# Patient Record
Sex: Male | Born: 1970 | Hispanic: Yes | Marital: Married | Smoking: Never smoker
Health system: Southern US, Community
[De-identification: ages and names within clinical notes are randomized; demographics above are authoritative.]

## PROBLEM LIST (undated history)

## (undated) DIAGNOSIS — I10 Essential (primary) hypertension: Secondary | ICD-10-CM

## (undated) HISTORY — PX: EYE SURGERY: SHX253

## (undated) HISTORY — DX: Essential (primary) hypertension: I10

---

## 2013-03-25 ENCOUNTER — Ambulatory Visit: Payer: BC Managed Care – PPO

## 2013-03-25 ENCOUNTER — Ambulatory Visit (INDEPENDENT_AMBULATORY_CARE_PROVIDER_SITE_OTHER): Payer: BC Managed Care – PPO | Admitting: Emergency Medicine

## 2013-03-25 VITALS — BP 132/80 | HR 76 | Temp 98.4°F | Resp 16 | Ht 67.75 in | Wt 209.6 lb

## 2013-03-25 DIAGNOSIS — R109 Unspecified abdominal pain: Secondary | ICD-10-CM

## 2013-03-25 DIAGNOSIS — K5900 Constipation, unspecified: Secondary | ICD-10-CM

## 2013-03-25 LAB — POCT CBC
Granulocyte percent: 49.5 %G (ref 37–80)
HCT, POC: 47.7 % (ref 43.5–53.7)
HEMOGLOBIN: 16.1 g/dL (ref 14.1–18.1)
LYMPH, POC: 2.6 (ref 0.6–3.4)
MCH: 29.9 pg (ref 27–31.2)
MCHC: 33.8 g/dL (ref 31.8–35.4)
MCV: 88.7 fL (ref 80–97)
MID (CBC): 0.4 (ref 0–0.9)
MPV: 12.1 fL (ref 0–99.8)
POC Granulocyte: 2.9 (ref 2–6.9)
POC LYMPH PERCENT: 44.2 %L (ref 10–50)
POC MID %: 6.3 % (ref 0–12)
Platelet Count, POC: 261 10*3/uL (ref 142–424)
RBC: 5.38 M/uL (ref 4.69–6.13)
RDW, POC: 12.8 %
WBC: 5.9 10*3/uL (ref 4.6–10.2)

## 2013-03-25 LAB — POCT URINALYSIS DIPSTICK
Bilirubin, UA: NEGATIVE
GLUCOSE UA: NEGATIVE
Ketones, UA: NEGATIVE
LEUKOCYTES UA: NEGATIVE
NITRITE UA: NEGATIVE
Protein, UA: NEGATIVE
Spec Grav, UA: >=1.03
UROBILINOGEN UA: 0.2
pH, UA: 6.5

## 2013-03-25 LAB — AMYLASE: Amylase: 39 U/L (ref 0–105)

## 2013-03-25 LAB — COMPREHENSIVE METABOLIC PANEL
ALT: 29 U/L (ref 0–53)
AST: 30 U/L (ref 0–37)
Albumin: 4.5 g/dL (ref 3.5–5.2)
Alkaline Phosphatase: 70 U/L (ref 39–117)
BILIRUBIN TOTAL: 0.4 mg/dL (ref 0.2–1.2)
BUN: 23 mg/dL (ref 6–23)
CO2: 27 mEq/L (ref 19–32)
CREATININE: 0.98 mg/dL (ref 0.50–1.35)
Calcium: 9.3 mg/dL (ref 8.4–10.5)
Chloride: 100 mEq/L (ref 96–112)
Glucose, Bld: 85 mg/dL (ref 70–99)
Potassium: 4.5 mEq/L (ref 3.5–5.3)
Sodium: 135 mEq/L (ref 135–145)
Total Protein: 7.8 g/dL (ref 6.0–8.3)

## 2013-03-25 LAB — LIPASE: LIPASE: 24 U/L (ref 0–75)

## 2013-03-25 MED ORDER — POLYETHYLENE GLYCOL 3350 17 GM/SCOOP PO POWD: 17.0000 g | Freq: Every day | ORAL | Status: AC

## 2013-03-25 NOTE — Patient Instructions (Signed)
Constipación - Adulto   (Constipation, Adult)   Constipación significa que una persona tiene menos de 3 evacuaciones en una semana, hay dificultad para evacuar el intestino, o las heces son secas, duras, o más grandes que lo normal. A medida que envejecemos el estreñimiento es más común. Si intenta curar el estreñimiento con medicamentos que producen la evacuación intestinal (laxantes), el problema puede empeorar. El uso prolongado de laxantes puede hacer que los músculos del colon se debiliten. Una dieta baja en fibra, no tomar suficientes líquidos y el uso de ciertos medicamentos pueden empeorar el estreñimiento.   CAUSAS   · Ciertos medicamentos, como los antidepresivos, analgésicos, suplementos de hierro, antiácidos y diuréticos.    · Algunas enfermedades, como la diabetes, el síndrome del colon irritable (SII), enfermedad de la tiroides, o depresión.    · No beber suficiente agua.    · No consumir suficientes alimentos ricos en fibra.    · Situaciones de estrés o viajes.  · Falta de actividad física o de ejercicio.  · No ir al baño cuando siente la necesidad.  · Ignorar la necesidad súbita de mover el intestino.  · Uso en exceso de laxantes.  SÍNTOMAS   · Evacuar el intestino menos de 3 veces a la semana.    · Dificultad para mover el intestino    · Tener las heces secas y duras, o más grandes que las normales.    · Sensación de estar lleno o distendido.    · Dolor en la parte baja del abdomen  · No se siente alivio después de evacuar el intestino.  DIAGNÓSTICO   El médico le hará una historia clínica y le hará un examen físico. Pueden hacerle exámenes adicionales para el estreñimiento grave. Algunas pruebas son:   · Un radiografía con enema de bario para examinar el recto, el colon y en algunos casos el intestino delgado.  · Una sigmoidoscopia para examinar el colon inferior.  · Una colonoscopia para examinar todo el colon.  TRATAMIENTO   El tratamiento dependerá de la gravedad de la constipación y de la  causa. Algunos tratamientos dietéticos son beber más líquidos y comer más alimentos ricos en fibra. El cambio en el estilo de vida incluye hacer ejercicios de manera regular. Si estas recomendaciones para realizar cambios en la dieta y en el estilo de vida no ayudan, el médico le puede indicar el uso de laxantes de venta libre para favorecer el movimiento intestinal. Los medicamentos con receta se pueden prescribir si los medicamentos de venta libre no lo mejoran.   INSTRUCCIONES PARA EL CUIDADO EN EL HOGAR   · Aumente el consumo de alimentos con fibra, como frutas, verduras, granos enteros y frijoles. Limite los azúcares ricos en grasas y procesados   en su dieta, tales como papas fritas, hamburguesas, galletas, dulces y refrescos.    · Puede agregar un suplemento de fibra a su dieta si no obtiene lo suficiente de los alimentos.    · Debe ingerir gran cantidad de líquido para mantener la orina de tono claro o color amarillo pálido.    · Haga ejercicios regularmente o según las indicaciones de su médico.    · Vaya al baño cuando sienta la necesidad de ir. No espere.  · Tome sólo la medicación que le indicó el profesional.  No tome otros medicamentos para la constipación sin consultar a su médico.  SOLICITE ATENCIÓN MÉDICA DE INMEDIATO SI:   · Observa sangre brillante en las heces.    · La constipación dura más de 4 días o empeora.    · Siente   dolor abdominal o rectal.    · Las heces son delgadas como un lápiz.  · Pierde peso de manera inexplicable.  ASEGÚRESE DE QUE:   · Comprende estas instrucciones.  · Controlará su enfermedad.  · Solicitará ayuda de inmediato si no mejora o empeora.  Document Released: 01/27/2007 Document Revised: 07/09/2011  Exit[employer]® Patient Information ©2014 [employer].

## 2013-03-25 NOTE — Progress Notes (Signed)
[facility] [address], [place] [id number] [phone]  Date:  03/25/2013   Name:  Samuel Bartlett   DOB:  08/23/1970   MRN:  [id number]  PCP:  No primary provider on file.    Chief Complaint: Abdominal Pain, Otalgia and Perianal Pain   History of Present Illness:  Samuel Bartlett is a 43 y.o. very pleasant male patient who presents with the following:  Say she has abdominal pain for the past eight months left abdomen when sits for hours.  Has frequent heartburn. No stool change, melena or BRBPR.  No nausea or vomiting.  No hematemesis.  Eating ok no weight loss.  No food intolerance.  Non smoker. No ASA or NSAID.  Stopped drinking a year ago.  No fever or chills.  Normal appetite.  No weight loss.  No improvement with over the counter medications or other home remedies. Denies other complaint or health concern today.   There are no active problems to display for this patient.   History reviewed. No pertinent past medical history.  Past Surgical History  Procedure Laterality Date  . Eye surgery      History  Substance Use Topics  . Smoking status: Never Smoker   . Smokeless tobacco: Not on file  . Alcohol Use: Yes    History reviewed. No pertinent family history.  No Known Allergies  Medication list has been reviewed and updated.  No current outpatient prescriptions on file prior to visit.   No current facility-administered medications on file prior to visit.    Review of Systems:  As per HPI, otherwise negative.    Physical Examination: Filed Vitals:   03/25/13 1411  BP: 132/80  Pulse: 76  Temp: 98.4 F (36.9 C)  Resp: 16   Filed Vitals:   03/25/13 1411  Height: 5' 7.75" (1.721 m)  Weight: 209 lb 9.6 oz (95.074 kg)   Body mass index is 32.1 kg/(m^2). Ideal Body Weight: Weight in (lb) to have BMI = 25: 162.9  GEN: WDWN, NAD, Non-toxic, A & O x 3 HEENT: Atraumatic, Normocephalic. Neck supple. No masses, No LAD. Ears and Nose: No  external deformity. CV: RRR, No M/G/R. No JVD. No thrill. No extra heart sounds. PULM: CTA B, no wheezes, crackles, rhonchi. No retractions. No resp. distress. No accessory muscle use. ABD: S, NT, ND, +BS. No rebound. No HSM. EXTR: No c/c/e NEURO Normal gait.  PSYCH: Normally interactive. Conversant. Not depressed or anxious appearing.  Calm demeanor.    Assessment and Plan: Abdominal pain  Constipation Labs pending miralax  Signed,  Phillips OdorJeffery Aerion Bagdasarian, MD   Results for orders placed in visit on 03/25/13  POCT CBC      Result Value Ref Range   WBC 5.9  4.6 - 10.2 K/uL   Lymph, poc 2.6  0.6 - 3.4   POC LYMPH PERCENT 44.2  10 - 50 %L   MID (cbc) 0.4  0 - 0.9   POC MID % 6.3  0 - 12 %M   POC Granulocyte 2.9  2 - 6.9   Granulocyte percent 49.5  37 - 80 %G   RBC 5.38  4.69 - 6.13 M/uL   Hemoglobin 16.1  14.1 - 18.1 g/dL   HCT, POC 57.847.7  46.943.5 - 53.7 %   MCV 88.7  80 - 97 fL   MCH, POC 29.9  27 - 31.2 pg   MCHC 33.8  31.8 - 35.4 g/dL   RDW, POC 12.8  Platelet Count, POC 261  142 - 424 K/uL   MPV 12.1  0 - 99.8 fL  POCT URINALYSIS DIPSTICK      Result Value Ref Range   Color, UA yellow     Clarity, UA clear     Glucose, UA neg     Bilirubin, UA neg     Ketones, UA neg     Spec Grav, UA >=1.030     Blood, UA tr-intact     pH, UA 6.5     Protein, UA neg     Urobilinogen, UA 0.2     Nitrite, UA neg     Leukocytes, UA Negative    [facility] reading (PRIMARY) by  Dr. Dareen Piano.  Negative AAS.

## 2016-05-11 ENCOUNTER — Encounter: Payer: Self-pay | Admitting: Gastroenterology

## 2017-03-04 ENCOUNTER — Emergency Department (HOSPITAL_COMMUNITY)
Admission: EM | Admit: 2017-03-04 | Discharge: 2017-03-04 | Disposition: A | Discharge status: Home / Self Care | Payer: BLUE CROSS/BLUE SHIELD | Attending: Emergency Medicine | Admitting: Emergency Medicine

## 2017-03-04 ENCOUNTER — Emergency Department (HOSPITAL_COMMUNITY): Payer: BLUE CROSS/BLUE SHIELD

## 2017-03-04 ENCOUNTER — Encounter (HOSPITAL_COMMUNITY): Payer: Self-pay | Admitting: *Deleted

## 2017-03-04 DIAGNOSIS — Z79899 Other long term (current) drug therapy: Secondary | ICD-10-CM | POA: Diagnosis not present

## 2017-03-04 DIAGNOSIS — R1084 Generalized abdominal pain: Secondary | ICD-10-CM | POA: Insufficient documentation

## 2017-03-04 LAB — CBC
HEMATOCRIT: 47.1 % (ref 39.0–52.0)
HEMOGLOBIN: 16 g/dL (ref 13.0–17.0)
MCH: 29.7 pg (ref 26.0–34.0)
MCHC: 34 g/dL (ref 30.0–36.0)
MCV: 87.4 fL (ref 78.0–100.0)
PLATELETS: 255 10*3/uL (ref 150–400)
RBC: 5.39 MIL/uL (ref 4.22–5.81)
RDW: 12.7 % (ref 11.5–15.5)
WBC: 5.6 10*3/uL (ref 4.0–10.5)

## 2017-03-04 LAB — COMPREHENSIVE METABOLIC PANEL
ALT: 49 U/L (ref 17–63)
ANION GAP: 7 (ref 5–15)
AST: 36 U/L (ref 15–41)
Albumin: 3.8 g/dL (ref 3.5–5.0)
Alkaline Phosphatase: 75 U/L (ref 38–126)
BUN: 20 mg/dL (ref 6–20)
CHLORIDE: 109 mmol/L (ref 101–111)
CO2: 23 mmol/L (ref 22–32)
CREATININE: 0.89 mg/dL (ref 0.61–1.24)
Calcium: 9 mg/dL (ref 8.9–10.3)
Glucose, Bld: 107 mg/dL — ABNORMAL HIGH (ref 65–99)
POTASSIUM: 4.3 mmol/L (ref 3.5–5.1)
SODIUM: 139 mmol/L (ref 135–145)
Total Bilirubin: 0.4 mg/dL (ref 0.3–1.2)
Total Protein: 6.6 g/dL (ref 6.5–8.1)

## 2017-03-04 LAB — URINALYSIS, ROUTINE W REFLEX MICROSCOPIC
Bilirubin Urine: NEGATIVE
GLUCOSE, UA: NEGATIVE mg/dL
HGB URINE DIPSTICK: NEGATIVE
Ketones, ur: NEGATIVE mg/dL
LEUKOCYTES UA: NEGATIVE
Nitrite: NEGATIVE
PROTEIN: NEGATIVE mg/dL
SPECIFIC GRAVITY, URINE: 1.021 (ref 1.005–1.030)
pH: 5 (ref 5.0–8.0)

## 2017-03-04 LAB — LIPASE, BLOOD: LIPASE: 40 U/L (ref 11–51)

## 2017-03-04 MED ORDER — OMEPRAZOLE 20 MG PO CPDR: 20.0000 mg | DELAYED_RELEASE_CAPSULE | Freq: Every day | ORAL | 0 refills | Status: AC

## 2017-03-04 MED ORDER — IOPAMIDOL (ISOVUE-300) INJECTION 61%
INTRAVENOUS | Status: AC
  Administered 2017-03-04: 100 mL
  Filled 2017-03-04: qty 100

## 2017-03-04 MED ORDER — SUCRALFATE 1 GM/10ML PO SUSP: 1.0000 g | Freq: Three times a day (TID) | ORAL | 0 refills | Status: AC

## 2017-03-04 NOTE — ED Notes (Signed)
ED Provider at bedside. 

## 2017-03-04 NOTE — ED Triage Notes (Signed)
Pt states for the last few years he has had intermittent RUQ pain that seems to come and go pt was seen for this before a year ago and had negative US. Pt states she when pain is present he sometimes has loose stools. Pt also reports weight loss over the last few months.

## 2017-03-04 NOTE — ED Notes (Signed)
Pt stats he understands instructions. Home stble with steady gait.

## 2017-03-04 NOTE — Discharge Instructions (Signed)
Your workup has been very reassuring in the ED.  Your CAT scan shows no acute findings.  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  You can follow-up with your primary care doctor with a CT scan report.  I would also follow-up with a GI doctor because you may need endoscopy or colonoscopy at some point.  Take the Prilosec and Carafate as needed to help with acid reflux.

## 2017-03-04 NOTE — ED Notes (Signed)
Patient transported to CT 

## 2017-03-04 NOTE — ED Notes (Signed)
Pt reports he had weight loss from 210 lbs down to 204 lbs

## 2017-03-04 NOTE — ED Provider Notes (Signed)
[facility] EMERGENCY DEPARTMENT Provider Note   CSN: [id number] Arrival date & time: 03/04/17  1105     History   Chief Complaint Chief Complaint  Patient presents with  . Abdominal Pain    HPI Samuel Bartlett is a 47 y.o. male.  HPI 47 year old Hispanic male with no pertinent past medical history presents to the emergency department today with complaints of generalized abdominal pain.  Patient states that he has had intermittent belly pain for the last 2-3 years.  He was seen last year for same symptoms with a negative ultrasound at that time and diagnosed with chronic abdominal pain.  He was encouraged to follow-up with GI which he has failed to do so.  Patient states that there is no aggravating factors.  There is no relieving factors.  Patient is not taking medications for his symptoms.  Not associated with food.  Describes as cramping in nature that self resolved after several minutes.  This is more generalized than any focal abdominal pain.  He does report some loose stools at times.  He reports a 4 pound weight loss over the past 2 years.  Patient has never had an endoscopy or colonoscopy.  Denies any nausea or emesis.  Denies any associated fever.  Denies any bloody stools. States that he just finally came in today to get it checked out.   Pt denies any fever, chill, ha, vision changes, lightheadedness, dizziness, congestion, neck pain, cp, sob, cough, n/v/d, urinary symptoms, melena, hematochezia, lower extremity paresthesias. No past medical history on file.  There are no active problems to display for this patient.   Past Surgical History:  Procedure Laterality Date  . EYE SURGERY         Home Medications    Prior to Admission medications   Medication Sig Start Date End Date Taking? Authorizing Provider  polyethylene glycol powder (GLYCOLAX/MIRALAX) powder Take 17 g by mouth daily. 03/25/13   Carmelina Dane, MD  ranitidine (ZANTAC) 150 MG tablet  Take 150 mg by mouth 2 (two) times daily.    [provider]    Family History No family history on file.  Social History Social History   Tobacco Use  . Smoking status: Never Smoker  Substance Use Topics  . Alcohol use: Yes  . Drug use: No     Allergies   Patient has no known allergies.   Review of Systems Review of Systems  Constitutional: Negative for chills and fever.  HENT: Negative for congestion and sore throat.   Eyes: Negative for visual disturbance.  Respiratory: Negative for cough and shortness of breath.   Cardiovascular: Negative for chest pain.  Gastrointestinal: Positive for abdominal pain (non at this time). Negative for diarrhea, nausea and vomiting.  Genitourinary: Negative for dysuria, flank pain, frequency, hematuria, scrotal swelling, testicular pain and urgency.  Musculoskeletal: Negative for arthralgias and myalgias.  Skin: Negative for rash.  Neurological: Negative for dizziness, syncope, weakness, light-headedness, numbness and headaches.  Psychiatric/Behavioral: Negative for sleep disturbance. The patient is not nervous/anxious.      Physical Exam Updated Vital Signs BP (!) 141/99 (BP Location: Right Arm)   Pulse 64   Temp 98.1 F (36.7 C) (Oral)   Resp 16   Ht 5\' 5"  (1.651 m)   Wt 93.4 kg (206 lb)   SpO2 99%   BMI 34.28 kg/m   Physical Exam  Constitutional: He is oriented to person, place, and time. He appears well-developed and well-nourished.  Non-toxic appearance.  No distress.  HENT:  Head: Normocephalic and atraumatic.  Mouth/Throat: Oropharynx is clear and moist.  Eyes: Conjunctivae are normal. Pupils are equal, round, and reactive to light. Right eye exhibits no discharge. Left eye exhibits no discharge.  Neck: Normal range of motion. Neck supple.  Cardiovascular: Normal rate, regular rhythm, normal heart sounds and intact distal pulses. Exam reveals no gallop and no friction rub.  No murmur heard. Pulmonary/Chest:  Effort normal and breath sounds normal. No respiratory distress. He exhibits no tenderness.  Abdominal: Soft. Bowel sounds are normal. He exhibits no distension. There is no tenderness. There is no rigidity, no rebound, no guarding, no CVA tenderness, no tenderness at McBurney's point and negative Murphy's sign.  Musculoskeletal: Normal range of motion. He exhibits no tenderness.  Lymphadenopathy:    He has no cervical adenopathy.  Neurological: He is alert and oriented to person, place, and time.  Skin: Skin is warm and dry. Capillary refill takes less than 2 seconds. No rash noted.  Psychiatric: His behavior is normal. Judgment and thought content normal.  Nursing note and vitals reviewed.    ED Treatments / Results  Labs (all labs ordered are listed, but only abnormal results are displayed) Labs Reviewed  COMPREHENSIVE METABOLIC PANEL - Abnormal; Notable for the following components:      Result Value   Glucose, Bld 107 (*)    All other components within normal limits  LIPASE, BLOOD  CBC  URINALYSIS, ROUTINE W REFLEX MICROSCOPIC    EKG  EKG Interpretation None       Radiology No results found.  Procedures Procedures (including critical care time)  Medications Ordered in ED Medications - No data to display   Initial Impression / Assessment and Plan / ED Course  I have reviewed the triage vital signs and the nursing notes.  Pertinent labs & imaging results that were available during my care of the patient were reviewed by me and considered in my medical decision making (see chart for details).     Patient reports the ED with intermittent abdominal pain over the past 3-4 years.  Denies any pain today or at this time.  Denies any other associated symptoms.  Patient is nontoxic, nonseptic appearing, in no apparent distress.  Patient's pain and other symptoms adequately managed in emergency department.  Labs, imaging and vitals reviewed.  No leukocytosis.  Liver enzymes  normal.  Normal lipase.  UA shows no signs of infection.  Patient does not meet the SIRS or Sepsis criteria.  CT scan shows no acute findings.  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  He does have a umbilical hernia containing only fat.  On repeat exam patient does not have a surgical abdomin and there are no peritoneal signs.  No indication of appendicitis, bowel obstruction, bowel perforation, cholecystitis, diverticulitis.   Patient discharged home with symptomatic treatment and given strict instructions for follow-up with their primary care physician and GI.  Pt is hemodynamically stable, in NAD, & able to ambulate in the ED. Evaluation does not show pathology that would require ongoing emergent intervention or inpatient treatment. I explained the diagnosis to the patient. Pain has been managed & has no complaints prior to dc. Pt is comfortable with above plan and is stable for discharge at this time. All questions were answered prior to disposition. Strict return precautions for f/u to the ED were discussed. Encouraged follow up with PCP.      Final Clinical Impressions(s) /  ED Diagnoses   Final diagnoses:  Generalized abdominal pain    ED Discharge Orders        Ordered    omeprazole (PRILOSEC) 20 MG capsule  Daily     03/04/17 1841    sucralfate (CARAFATE) 1 GM/10ML suspension  3 times daily with meals & bedtime     03/04/17 1841       Rise MuLeaphart, Liban Guedes T, PA-C 03/04/17 1913    Gwyneth SproutPlunkett, Whitney, MD 03/04/17 2212

## 2017-03-04 NOTE — ED Notes (Signed)
Pt oob to bathroom with steady gait. 

## 2017-03-26 ENCOUNTER — Encounter: Payer: Self-pay | Admitting: Gastroenterology

## 2017-05-06 ENCOUNTER — Encounter: Payer: Self-pay | Admitting: Gastroenterology

## 2017-05-06 ENCOUNTER — Ambulatory Visit (INDEPENDENT_AMBULATORY_CARE_PROVIDER_SITE_OTHER): Payer: BLUE CROSS/BLUE SHIELD | Admitting: Gastroenterology

## 2017-05-06 VITALS — BP 122/80 | HR 72 | Ht 66.0 in | Wt 214.0 lb

## 2017-05-06 DIAGNOSIS — K5900 Constipation, unspecified: Secondary | ICD-10-CM

## 2017-05-06 DIAGNOSIS — K219 Gastro-esophageal reflux disease without esophagitis: Secondary | ICD-10-CM | POA: Diagnosis not present

## 2017-05-06 DIAGNOSIS — K760 Fatty (change of) liver, not elsewhere classified: Secondary | ICD-10-CM

## 2017-05-06 MED ORDER — OMEPRAZOLE 20 MG PO CPDR: 20.0000 mg | DELAYED_RELEASE_CAPSULE | Freq: Every day | ORAL | 2 refills | Status: AC

## 2017-05-06 NOTE — Patient Instructions (Addendum)
If you are age 45 or older, your body mass index should be between 23-30. Your Body mass index is 34.54 kg/m. If this is out of the aforementioned range listed, please consider follow up with your Primary Care Provider.  If you are age 45 or younger, your body mass index should be between 19-25. Your Body mass index is 34.54 kg/m. If this is out of the aformentioned range listed, please consider follow up with your Primary Care Provider.   We have sent the following medications to your pharmacy for you to pick up at your convenience: Omeprazole 20 mg once daily for 12 weeks.  Miralax 17 grams by mouth twice daily.   Thank you,  Dr. Lynann Bolognaajesh Gupta

## 2017-05-06 NOTE — Progress Notes (Signed)
Chief Complaint: abdominal pain  Referring Provider:  Dr Ruben Im    ASSESSMENT AND PLAN;  1. Abdominal pain, chronic, right upper quadrant with negative CT 03/04/2017, normal labs including lipase. 2. Fatty liver with normal LFTs on CT and [place]. 3. BMI 33.0-33.9,adult  4. Chronic constipation (evident on CT as well). Will continue amlodipine. 5. 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   PLAN: -I have reviewed the CT scan of the abdomen and pelvis with the patient in detail.  I have shown him the actual films.  Copy of the CT report was given. -Omeprazole 20 mg p.o. once a day for 12 weeks -Stop alcohol. -Stop any and all Motrin, Advil, Aleve, etc. -Reduce weight -portion control, stop all sodas, start exercising, stop fried foods. -Monitor weight -He will call us in 6 weeks.  If he is not better, proceed with EGD for further evaluation. -He would like to hold off on colonoscopy for now.  I do agree since he is not having any red flag symptoms.  If he improves, then he should get colonoscopy at the age of 47 as a part of colorectal cancer screening.         HPI:    47 year old known to [place] from [place].  With right upper quadrant abdominal pain intermittently.  Had ultrasound done previously which showed fatty liver and right renal scarring.  Seen in the emergency room, had CT scan of the abdomen and pelvis which showed similar findings with associated constipation.  He had normal CBC, CMP and lipase.  He has been having heartburn with occasional regurgitation but denies having any nausea/vomiting.  He does drink occasional alcohol.  He denies having any fever chills or night sweats.  There has been no recent weight loss.  He would occasionally get constipated.  He was given omeprazole and Carafate in the emergency room and told to follow-up in the GI clinic.  He denies use of any significant nonsteroidals.   Review of Systems: General: Denies any fever, chills, night sweats,  anorexia, fatigue, weakness, malaise, weight loss.  Skin: Denies rash, itching, dry skin, hives, moles, warts, or unhealing ulcers.  HEENT: Not present - nasal congestion, sinus pain, hoarseness and sore throat. Respiratory: Denies dyspnea at rest, dyspnea with exercise, cough, sputum, wheezing, coughing up blood. Cardiovascular: Denies any chest pain, angina, palpitations, syncope, orthopnea, PND, peripheral edema, and claudication. GU : Denies urinary burning, blood in urine, urinary frequency, urinary hesitancy, nocturnal urination, and urinary incontinence. Musculoskeletal: denies joint pain, extremity pain. Denies muscle weakness, cramps, atrophy.  Neurological:  Denies any headaches, dizziness, paresthesias and seizures. Psych: Denies depression, anxiety or insomnia. No memory loss, suicidal ideation, hallucinations, paranoia, and confusion. Heme: Denies bruising, bleeding, and enlarged lymph nodes or prolonged bleeding.     Past Medical History:  Diagnosis Date  . High blood pressure      Past Surgical History:  Procedure Laterality Date  . EYE SURGERY     History reviewed. No pertinent family history. Social History   Tobacco Use  . Smoking status: Never Smoker  . Smokeless tobacco: Never Used  Substance Use Topics  . Alcohol use: Yes  . Drug use: No   Current Outpatient Medications  Medication Sig Dispense Refill  . amLODipine (NORVASC) 10 MG tablet Take 10 mg by mouth daily.    . calcium carbonate (OS-CAL - DOSED IN MG OF ELEMENTAL CALCIUM) 1250 (500 Ca) MG tablet Take 1 tablet by mouth.    Marland Kitchen omeprazole (PRILOSEC)  20 MG capsule Take 1 capsule (20 mg total) by mouth daily. (Patient not taking: Reported on 05/06/2017) 30 capsule 0  . omeprazole (PRILOSEC) 20 MG capsule Take 1 capsule (20 mg total) by mouth daily. 30 capsule 2  . polyethylene glycol powder (GLYCOLAX/MIRALAX) powder Take 17 g by mouth daily. (Patient not taking: Reported on 05/06/2017) 3350 g 1  .  ranitidine (ZANTAC) 150 MG tablet Take 150 mg by mouth 2 (two) times daily.    . sucralfate (CARAFATE) 1 GM/10ML suspension Take 10 mLs (1 g total) by mouth 4 (four) times daily -  with meals and at bedtime. (Patient not taking: Reported on 05/06/2017) 420 mL 0   No current facility-administered medications for this visit.    No Known Allergies  Review of Systems:  Constitutional: Denies fever, chills, diaphoresis, appetite change and fatigue.  HEENT: Denies photophobia, eye pain, redness, hearing loss, ear pain, congestion, sore throat, rhinorrhea, sneezing, mouth sores, neck pain, neck stiffness and tinnitus.   Respiratory: Denies SOB, DOE, cough, chest tightness,  and wheezing.   Cardiovascular: Denies chest pain, palpitations and leg swelling.  Genitourinary: Denies dysuria, urgency, frequency, hematuria, flank pain and difficulty urinating.  Musculoskeletal: Denies myalgias, back pain, joint swelling, arthralgias and gait problem.  Skin: No rash. .  Neurological: Denies dizziness, seizures, syncope, weakness, light-headedness, numbness and headaches.  Hematological: Denies adenopathy. Easy bruising, personal or family bleeding history  Psychiatric/Behavioral: No anxiety or depression     Physical Exam:    BP 122/80   Pulse 72   Ht 5\' 6"  (1.676 m)   Wt 214 lb (97.1 kg)   BMI 34.54 kg/m  Constitutional:  Well-developed, in no acute distress. Psychiatric: Normal mood and affect. Behavior is normal. HEENT: Pupils normal.  Conjunctivae are normal. No scleral icterus. Neck supple.  Cardiovascular: Normal rate, regular rhythm. No edema Pulmonary/chest: Effort normal and breath sounds normal. No wheezing, rales or rhonchi. Abdominal: Soft, nondistended. Nontender. Bowel sounds active throughout. There are no masses palpable. No hepatomegaly. Sma umbilical hernia.ll  Rectal:    Neurological: Alert and oriented to person place and time. Skin: Skin is warm and dry. No rashes  noted.  CBC Latest Ref Rng & Units 03/04/2017 03/25/2013  WBC 4.0 - 10.5 K/uL 5.6 5.9  Hemoglobin 13.0 - 17.0 g/dL 78.416.0 69.616.1  Hematocrit 29.539.0 - 52.0 % 47.1 47.7  Platelets 150 - 400 K/uL 255 -   CMP Latest Ref Rng & Units 03/04/2017 03/25/2013  Glucose 65 - 99 mg/dL 284(X107(H) 85  BUN 6 - 20 mg/dL 20 23  Creatinine 3.240.61 - 1.24 mg/dL 4.010.89 0.270.98  Sodium 253135 - 145 mmol/L 139 135  Potassium 3.5 - 5.1 mmol/L 4.3 4.5  Chloride 101 - 111 mmol/L 109 100  CO2 22 - 32 mmol/L 23 27  Calcium 8.9 - 10.3 mg/dL 9.0 9.3  Total Protein 6.5 - 8.1 g/dL 6.6 7.8  Total Bilirubin 0.3 - 1.2 mg/dL 0.4 0.4  Alkaline Phos 38 - 126 U/L 75 70  AST 15 - 41 U/L 36 30  ALT 17 - 63 U/L 49 29   CT-scan of abdomen and pelvis: 03/04/2017 IMPRESSION: 1. 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 Lower pole moieties in the setting of duplicated collecting systems are more susceptible to reflux, which may have contributed to the scarring apparent today. 2. Very small umbilical hernia contains adipose tissue.  Lynann Bolognaajesh Larae Caison, MD  Cc Dr Martha Clan

## 2019-08-13 IMAGING — CT CT ABD-PELV W/ CM
2 of 5 series · 16 of 46 positions shown, 18 images · IV contrast (Omni 300)
Comparison: Radiographs from 03/25/2013

CLINICAL DATA: Upper abdominal pain for 2 years, but worsening
today.

EXAM:
CT ABDOMEN AND PELVIS WITH CONTRAST
TECHNIQUE: Multidetector CT imaging of the abdomen and pelvis was performed
using the standard protocol following bolus administration of
intravenous contrast.
CONTRAST:  100mL UFOPLH-TKK IOPAMIDOL (UFOPLH-TKK) INJECTION 61%

[Series 3: a/p w/ 5mm · axial · 0.78mm/px · z∈[-520,-60]mm · 13 of 104 slices shown, 15 images]
[im 6/104  soft-tissue]
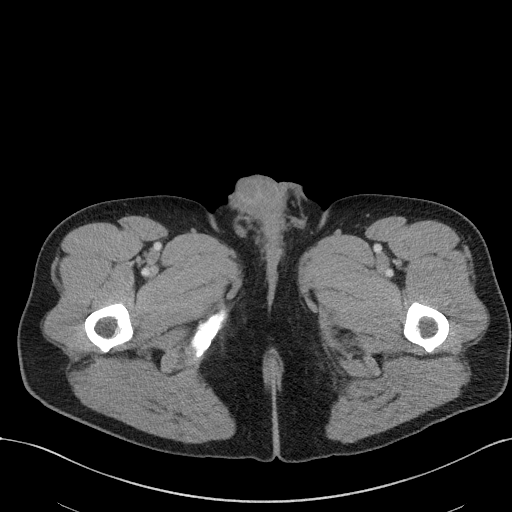
[im 6/104  bone]
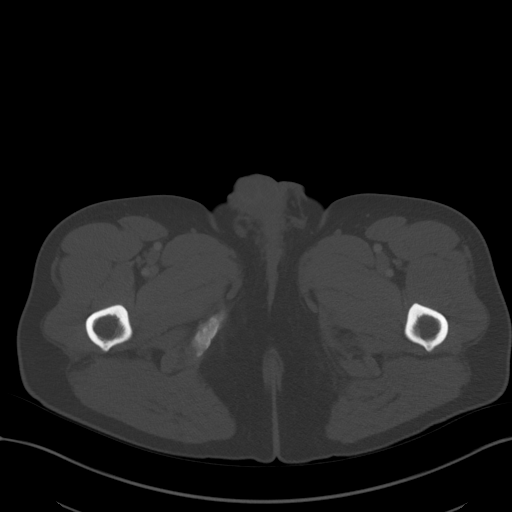
[im 17/104  soft-tissue]
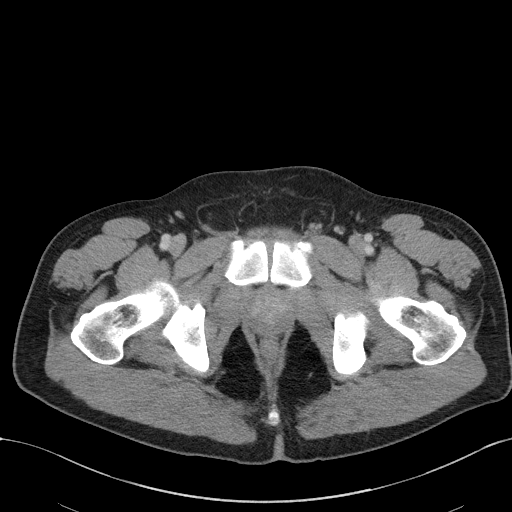
[im 22/104  soft-tissue]
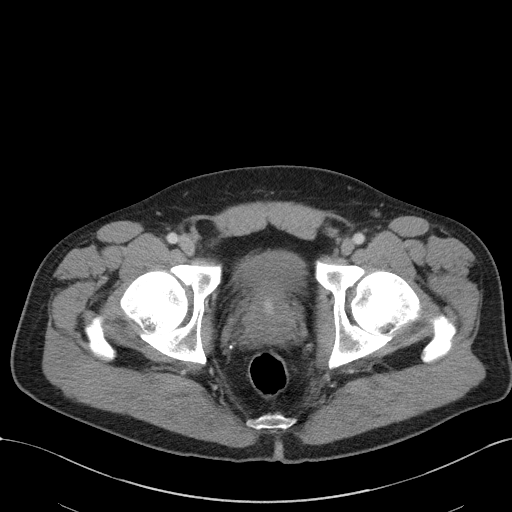
[im 28/104  soft-tissue]
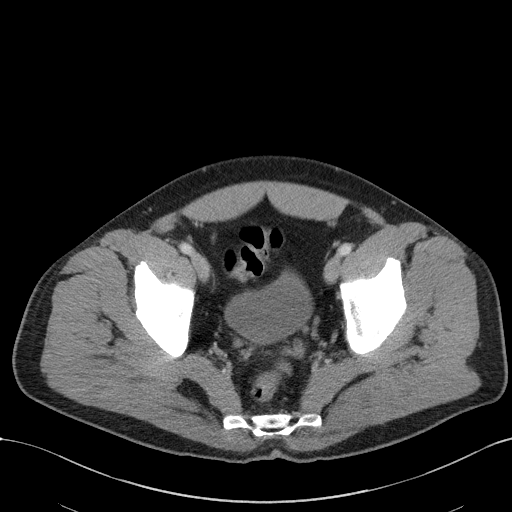
[im 38/104  soft-tissue]
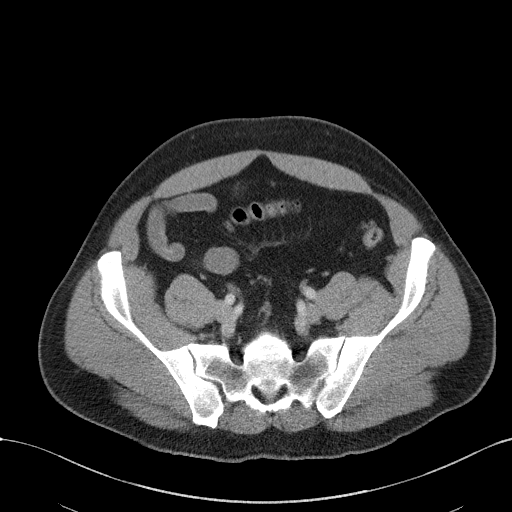
[im 44/104  soft-tissue]
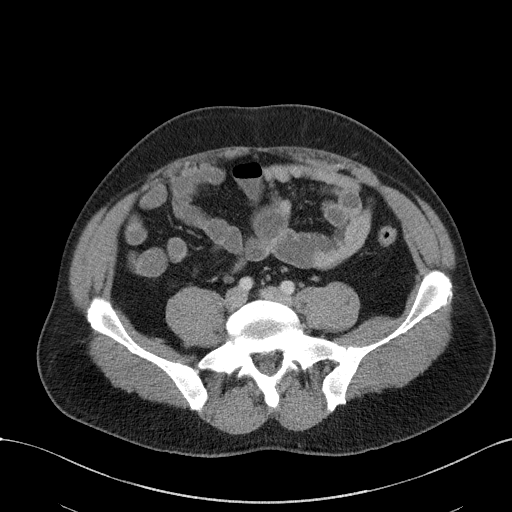
[im 55/104  soft-tissue]
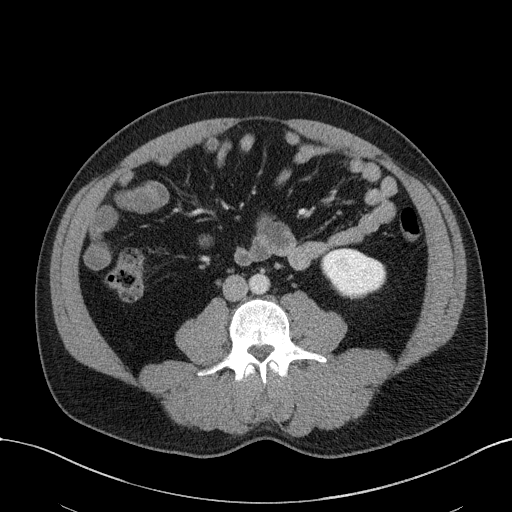
[im 60/104  soft-tissue]
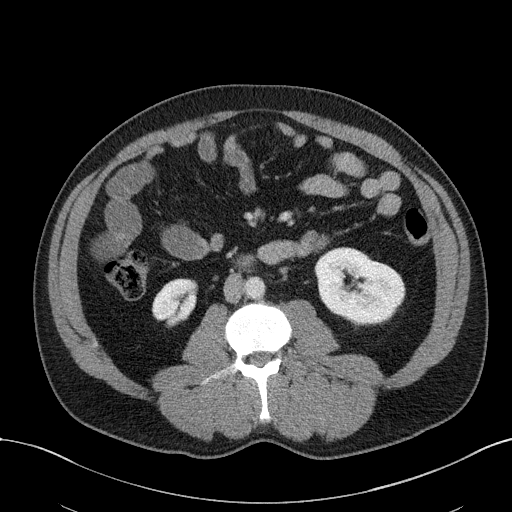
[im 66/104  soft-tissue]
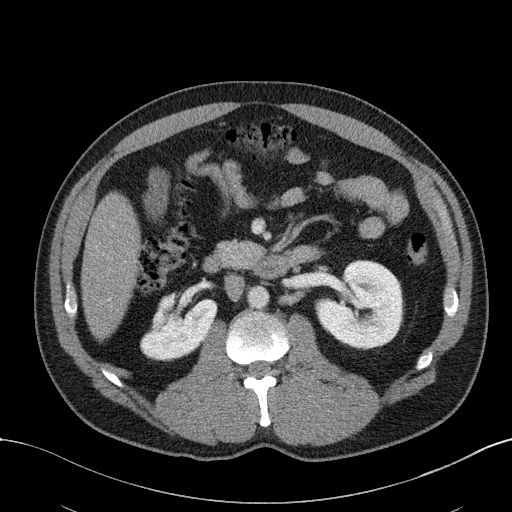
[im 66/104  bone]
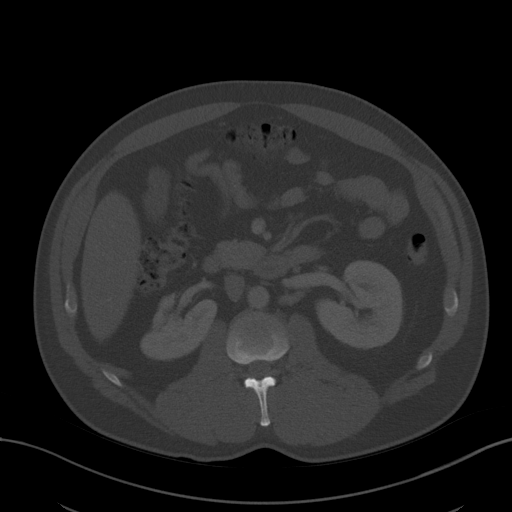
[im 76/104  soft-tissue]
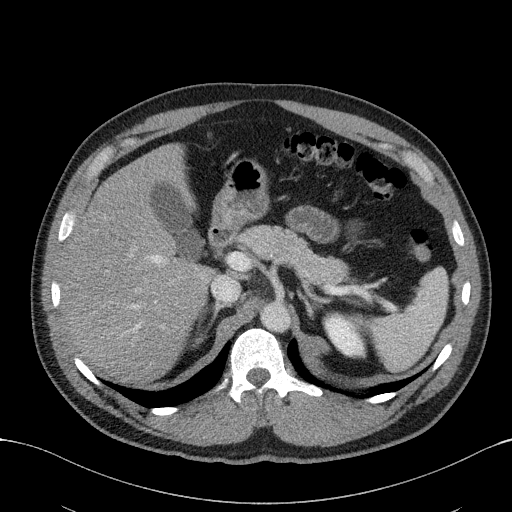
[im 82/104  soft-tissue]
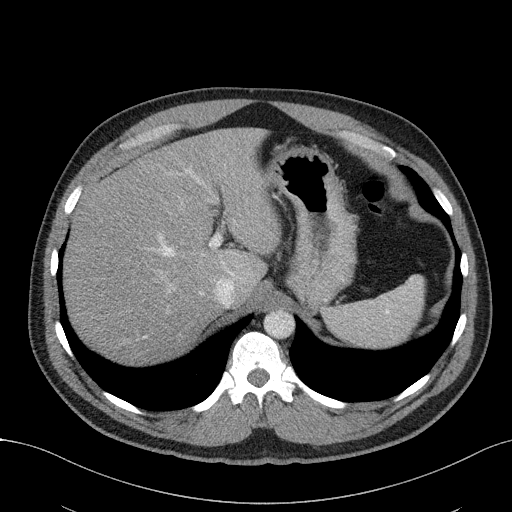
[im 87/104  soft-tissue]
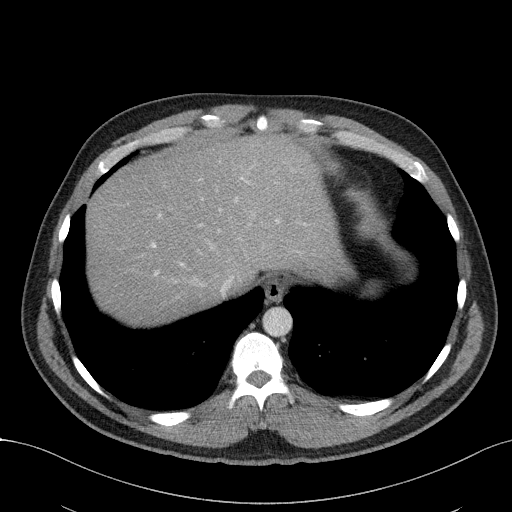
[im 98/104  soft-tissue]
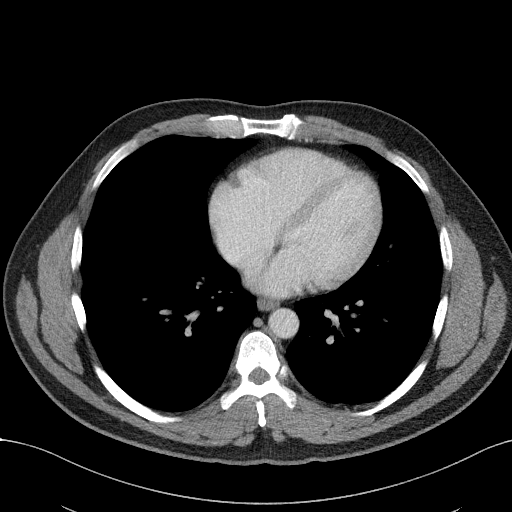

[Series 6: a/p w/ cor · coronal · 0.80mm/px · 3 of 131 slices shown]
[im 44/131  soft-tissue]
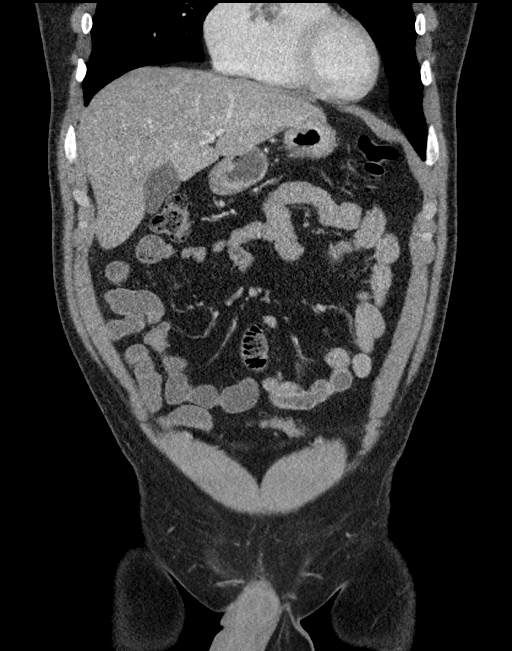
[im 58/131  soft-tissue]
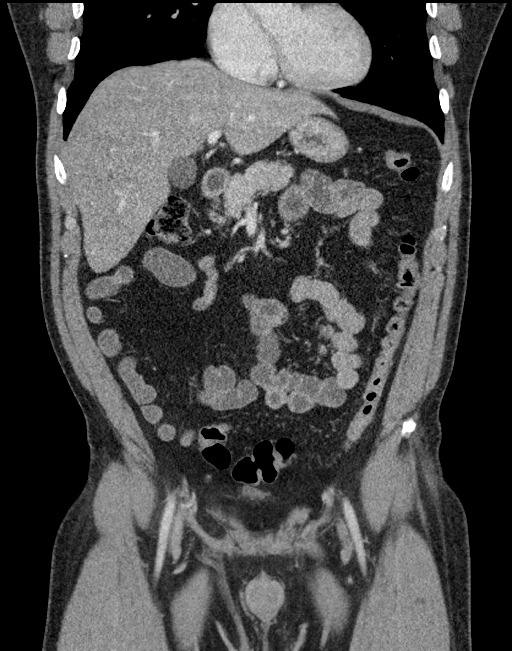
[im 73/131  soft-tissue]
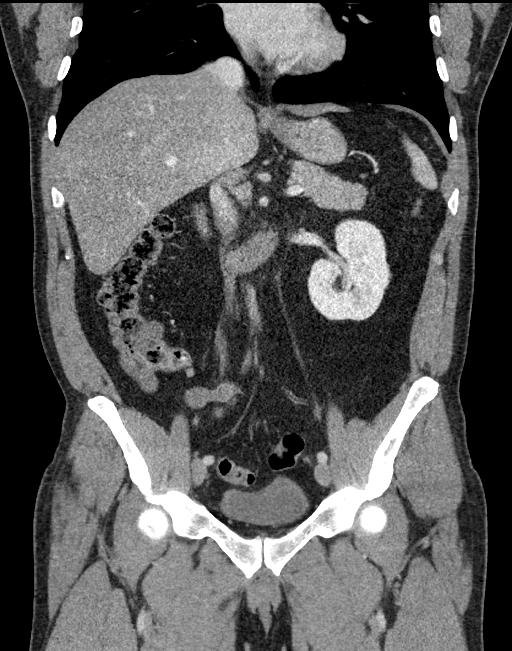

[16 of 46 positions shown; findings below may reference images not displayed]

FINDINGS: Lower chest: Unremarkable

Hepatobiliary: Unremarkable

Pancreas: Unremarkable

Spleen: Unremarkable

Adrenals/Urinary Tract: Notable scarring in the right mid kidney and
right kidney lower pole. 7 mm hypodense right mid kidney lesion
posteriorly on image 37/3 is technically too small to characterize
although statistically likely to be a benign cyst. Duplicated right
renal collecting system. No current hydroureter or urinary tract
calculi.

Stomach/Bowel: Unremarkable

Vascular/Lymphatic: Unremarkable

Reproductive: Unremarkable

Other: No supplemental non-categorized findings.

Musculoskeletal: Small umbilical hernia contains adipose tissue.
IMPRESSION: 1. Duplicated right renal collecting system with considerable
scarring of the right kidney lower pole but no current findings of
inflammation or hydroureter. Lower pole moieties in the setting of
duplicated collecting systems are more susceptible to reflux, which
may have contributed to the scarring apparent today.
2. Very small umbilical hernia contains adipose tissue.
